# Patient Record
Sex: Female | Born: 1950 | Hispanic: Yes | Marital: Married | Smoking: Never smoker
Health system: Southern US, Community
[De-identification: ages and names within clinical notes are randomized; demographics above are authoritative.]

## PROBLEM LIST (undated history)

## (undated) DIAGNOSIS — E119 Type 2 diabetes mellitus without complications: Secondary | ICD-10-CM

## (undated) DIAGNOSIS — J40 Bronchitis, not specified as acute or chronic: Secondary | ICD-10-CM

## (undated) DIAGNOSIS — I10 Essential (primary) hypertension: Secondary | ICD-10-CM

## (undated) HISTORY — PX: ABDOMINAL HYSTERECTOMY: SHX81

---

## 2013-07-14 ENCOUNTER — Encounter (HOSPITAL_COMMUNITY): Payer: Self-pay | Admitting: Emergency Medicine

## 2013-07-14 ENCOUNTER — Emergency Department (HOSPITAL_COMMUNITY)
Admission: EM | Admit: 2013-07-14 | Discharge: 2013-07-14 | Disposition: A | Discharge status: Home / Self Care | Payer: Self-pay | Attending: Emergency Medicine | Admitting: Emergency Medicine

## 2013-07-14 DIAGNOSIS — I10 Essential (primary) hypertension: Secondary | ICD-10-CM | POA: Insufficient documentation

## 2013-07-14 DIAGNOSIS — R519 Headache, unspecified: Secondary | ICD-10-CM

## 2013-07-14 DIAGNOSIS — E119 Type 2 diabetes mellitus without complications: Secondary | ICD-10-CM | POA: Insufficient documentation

## 2013-07-14 DIAGNOSIS — R51 Headache: Secondary | ICD-10-CM | POA: Insufficient documentation

## 2013-07-14 DIAGNOSIS — J40 Bronchitis, not specified as acute or chronic: Secondary | ICD-10-CM | POA: Insufficient documentation

## 2013-07-14 HISTORY — DX: Essential (primary) hypertension: I10

## 2013-07-14 HISTORY — DX: Type 2 diabetes mellitus without complications: E11.9

## 2013-07-14 HISTORY — DX: Bronchitis, not specified as acute or chronic: J40

## 2013-07-14 LAB — BASIC METABOLIC PANEL
BUN: 14 mg/dL (ref 6–23)
CHLORIDE: 102 meq/L (ref 96–112)
CO2: 24 mEq/L (ref 19–32)
CREATININE: 0.51 mg/dL (ref 0.50–1.10)
Calcium: 8.9 mg/dL (ref 8.4–10.5)
GFR calc Af Amer: >90 mL/min (ref >90)
GFR calc non Af Amer: >90 mL/min (ref >90)
Glucose, Bld: 101 mg/dL — ABNORMAL HIGH (ref 70–99)
Potassium: 4 mEq/L (ref 3.7–5.3)
Sodium: 140 mEq/L (ref 137–147)

## 2013-07-14 LAB — CBC
HEMATOCRIT: 41 % (ref 36.0–46.0)
HEMOGLOBIN: 14 g/dL (ref 12.0–15.0)
MCH: 29.7 pg (ref 26.0–34.0)
MCHC: 34.1 g/dL (ref 30.0–36.0)
MCV: 87 fL (ref 78.0–100.0)
Platelets: 203 10*3/uL (ref 150–400)
RBC: 4.71 MIL/uL (ref 3.87–5.11)
RDW: 13.1 % (ref 11.5–15.5)
WBC: 8 10*3/uL (ref 4.0–10.5)

## 2013-07-14 LAB — SEDIMENTATION RATE: Sed Rate: 8 mm/hr (ref 0–22)

## 2013-07-14 LAB — I-STAT CHEM 8, ED
BUN: 13 mg/dL (ref 6–23)
CALCIUM ION: 1.03 mmol/L — AB (ref 1.13–1.30)
CHLORIDE: 109 meq/L (ref 96–112)
CREATININE: 0.6 mg/dL (ref 0.50–1.10)
GLUCOSE: 100 mg/dL — AB (ref 70–99)
HEMATOCRIT: 42 % (ref 36.0–46.0)
Hemoglobin: 14.3 g/dL (ref 12.0–15.0)
POTASSIUM: 3.8 meq/L (ref 3.7–5.3)
Sodium: 140 mEq/L (ref 137–147)
TCO2: 24 mmol/L (ref 0–100)

## 2013-07-14 MED ORDER — SODIUM CHLORIDE 0.9 % IV BOLUS (SEPSIS)
1000.0000 mL | Freq: Once | INTRAVENOUS | Status: AC
  Administered 2013-07-14: 1000 mL via INTRAVENOUS

## 2013-07-14 MED ORDER — KETOROLAC TROMETHAMINE 15 MG/ML IJ SOLN
15.0000 mg | Freq: Once | INTRAMUSCULAR | Status: AC
  Administered 2013-07-14: 15 mg via INTRAVENOUS
  Filled 2013-07-14: qty 1

## 2013-07-14 MED ORDER — DIPHENHYDRAMINE HCL 50 MG/ML IJ SOLN
12.5000 mg | Freq: Once | INTRAMUSCULAR | Status: AC
  Administered 2013-07-14: 12.5 mg via INTRAVENOUS
  Filled 2013-07-14: qty 1

## 2013-07-14 MED ORDER — FLUORESCEIN SODIUM 1 MG OP STRP
1.0000 | ORAL_STRIP | Freq: Once | OPHTHALMIC | Status: AC
  Administered 2013-07-14: 1 via OPHTHALMIC
  Filled 2013-07-14: qty 1

## 2013-07-14 MED ORDER — TETRACAINE HCL 0.5 % OP SOLN
1.0000 [drp] | Freq: Once | OPHTHALMIC | Status: AC
  Administered 2013-07-14: 1 [drp] via OPHTHALMIC
  Filled 2013-07-14: qty 2

## 2013-07-14 MED ORDER — METOCLOPRAMIDE HCL 5 MG/ML IJ SOLN
10.0000 mg | Freq: Once | INTRAMUSCULAR | Status: AC
  Administered 2013-07-14: 10 mg via INTRAVENOUS
  Filled 2013-07-14: qty 2

## 2013-07-14 NOTE — ED Notes (Signed)
PA at bedside.

## 2013-07-14 NOTE — Discharge Instructions (Signed)

## 2013-07-14 NOTE — ED Notes (Signed)
63 yo with c/o headache pain that started in the left eye and radiates to the left side of the head. Pt. Is unable to see clearly out of the left eye and states that it is swollen shut in the morning. Pt previously given neomycin eye drops 2 weeks ago. A/O denies N/V/D. 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

## 2013-07-14 NOTE — ED Provider Notes (Signed)
CSN: [id number]     Arrival date & time 07/14/13  [id number] History   First MD Initiated Contact with Patient 07/14/13 0827     Chief Complaint  Patient presents with  . Migraine     (Consider location/radiation/quality/duration/timing/severity/associated sxs/prior Treatment) HPI  This is a 63 y.o. female with PMH of HTN, DM, presenting with headache. Onset one month ago. Located left retro-orbital area. Waxes and wanes. Described as throbbing. Alleviated with Motrin. Radiates to the left occipital area. Negative for nausea, vomiting, change in vision, change in speech, weakness, numbness, or tingling. Negative for local swelling. Positive for family history of migraine headaches.  Past Medical History  Diagnosis Date  . Bronchitis   . Hypertension   . Diabetes mellitus without complication    Past Surgical History  Procedure Laterality Date  . Abdominal hysterectomy     No family history on file. History  Substance Use Topics  . Smoking status: Never Smoker   . Smokeless tobacco: Never Used  . Alcohol Use: No   OB History   Grav Para Term Preterm Abortions TAB SAB Ect Mult Living                 Review of Systems  Constitutional: Negative for fever and chills.  HENT: Negative for facial swelling.   Eyes: Negative for photophobia and pain.  Respiratory: Negative for cough and shortness of breath.   Cardiovascular: Negative for chest pain and leg swelling.  Gastrointestinal: Negative for nausea, vomiting and abdominal pain.  Genitourinary: Negative for dysuria.  Musculoskeletal: Negative for arthralgias.  Skin: Negative for rash and wound.  Neurological: Positive for headaches. Negative for seizures.  Hematological: Negative for adenopathy.      Allergies  Review of patient's allergies indicates no known allergies.  Home Medications   Prior to Admission medications   Medication Sig Start Date End Date Taking? Authorizing Provider  ibuprofen (ADVIL,MOTRIN) 200 MG  tablet Take 600 mg by mouth every 6 (six) hours as needed for headache.   Yes Historical Provider, MD  lisinopril (PRINIVIL,ZESTRIL) 10 MG tablet Take 10 mg by mouth daily.   Yes Historical Provider, MD  metFORMIN (GLUCOPHAGE) 500 MG tablet Take 500 mg by mouth daily.   Yes Historical Provider, MD  neomycin-polymyxin-dexamethasone (MAXITROL) 0.1 % ophthalmic suspension Place 1 drop into both eyes 4 (four) times daily.   Yes Historical Provider, MD   BP 154/88  Pulse 80  Temp(Src) 97.6 F (36.4 C)  Resp 14  SpO2 99% Physical Exam  Constitutional: She is oriented to person, place, and time. She appears well-developed and well-nourished. No distress.  HENT:  Head: Normocephalic and atraumatic.  Mouth/Throat: No oropharyngeal exudate.  Eyes: Lids are normal. Lids are everted and swept, no foreign bodies found. Right eye exhibits no chemosis, no discharge, no exudate and no hordeolum. No foreign body present in the right eye. Left eye exhibits no chemosis, no discharge, no exudate and no hordeolum. No foreign body present in the left eye. Right conjunctiva is injected. No scleral icterus. Right eye exhibits normal extraocular motion and no nystagmus. Left eye exhibits normal extraocular motion and no nystagmus. Right pupil is round and reactive. Left pupil is round and reactive. Pupils are equal.  Fundoscopic exam:      The right eye shows no papilledema.       The left eye shows no papilledema.  Slit lamp exam:      The right eye shows no corneal abrasion.  The left eye shows no corneal abrasion and no fluorescein uptake.  IOP WNL on tono pen exam  Neck: Normal range of motion. No tracheal deviation present. No thyromegaly present.  Cardiovascular: Normal rate, regular rhythm and normal heart sounds.  Exam reveals no gallop and no friction rub.   No murmur heard. Pulmonary/Chest: Effort normal and breath sounds normal. No stridor. No respiratory distress. She has no wheezes. She has no  rales. She exhibits no tenderness.  Abdominal: Soft. She exhibits no distension and no mass. There is no tenderness. There is no rebound and no guarding.  Musculoskeletal: Normal range of motion. She exhibits no edema.  Neurological: She is alert and oriented to person, place, and time.  Skin: Skin is warm and dry. She is not diaphoretic.    ED Course  Procedures (including critical care time) Labs Review Labs Reviewed  BASIC METABOLIC PANEL - Abnormal; Notable for the following:    Glucose, Bld 101 (*)    All other components within normal limits  I-STAT CHEM 8, ED - Abnormal; Notable for the following:    Glucose, Bld 100 (*)    Calcium, Ion 1.03 (*)    All other components within normal limits  CBC  SEDIMENTATION RATE    MDM   Final diagnoses:  None    This is a 63 y.o. female with PMH of HTN, DM, presenting with headache. Onset one month ago. Located left retro-orbital area. Waxes and wanes. Described as throbbing. Alleviated with Motrin. Radiates to the left occipital area. Negative for nausea, vomiting, change in vision, change in speech, weakness, numbness, or tingling. Negative for local swelling. Positive for family history of migraine headaches.  ENT, neurologic exam is within normal limits. I do not believe that this represents ICH, CVA, thrombosis, dissection, aneurysm, meningitis, temporal arteritis, glaucoma or any other concerning etiology. I've treated with fluids, Toradol, Reglan, Benadryl with resolution of headache. Reexamination reveals no concerning findings. She is currently asymptomatic.  Patient's intraocular pressures within normal limits. Negative for corneal ulcer. Patient is ambulating without assistance.Pt stable for discharge, FU.  All questions answered.  Return precautions given.  I have discussed case and care has been guided by my attending physician, Dr. Loretha StaplerWofford.  Loma BostonStirling Emet Rafanan, MD 07/14/13 [phone]

## 2013-07-16 NOTE — ED Provider Notes (Signed)
I saw and evaluated the patient, reviewed the resident's note and I agree with the findings and plan.   EKG Interpretation None      63 yo female with headache which has been consistent with several headaches that she has experienced recently.  Her headaches are sometimes on the right side and sometimes on the left side.  Today it's on the left.  On exam, alert, oriented, nontoxic, but uncomfortable appearing, photophobic with sunglasses on, PERRL, equal grip strength, normal perfusion and respiratory effort.  Headache resolved with treatment in ED.  Advised follow up.    Clinical Impression: 1. Headache         Candyce ChurnJohn David Adaly Puder III, MD 07/16/13 [phone]

## 2019-01-06 ENCOUNTER — Other Ambulatory Visit: Payer: Self-pay | Admitting: Family Medicine

## 2019-01-06 DIAGNOSIS — Z1231 Encounter for screening mammogram for malignant neoplasm of breast: Secondary | ICD-10-CM

## 2019-01-08 ENCOUNTER — Other Ambulatory Visit: Payer: Self-pay

## 2019-01-08 ENCOUNTER — Ambulatory Visit
Admission: RE | Admit: 2019-01-08 | Discharge: 2019-01-08 | Disposition: A | Discharge status: Home / Self Care | Payer: Medicare Other | Source: Ambulatory Visit | Attending: Family Medicine | Admitting: Family Medicine

## 2019-01-08 DIAGNOSIS — Z1231 Encounter for screening mammogram for malignant neoplasm of breast: Secondary | ICD-10-CM

## 2020-11-07 IMAGING — MG DIGITAL SCREENING BILAT W/ TOMO W/ CAD
8 series · 9 of 24 positions shown · non-contrast
Comparison: None.

ACR Breast Density Category a: The breast tissue is almost entirely
fatty.

CLINICAL DATA: Screening.

EXAM:
DIGITAL SCREENING BILATERAL MAMMOGRAM WITH TOMO AND CAD

[L CC synth-2D]
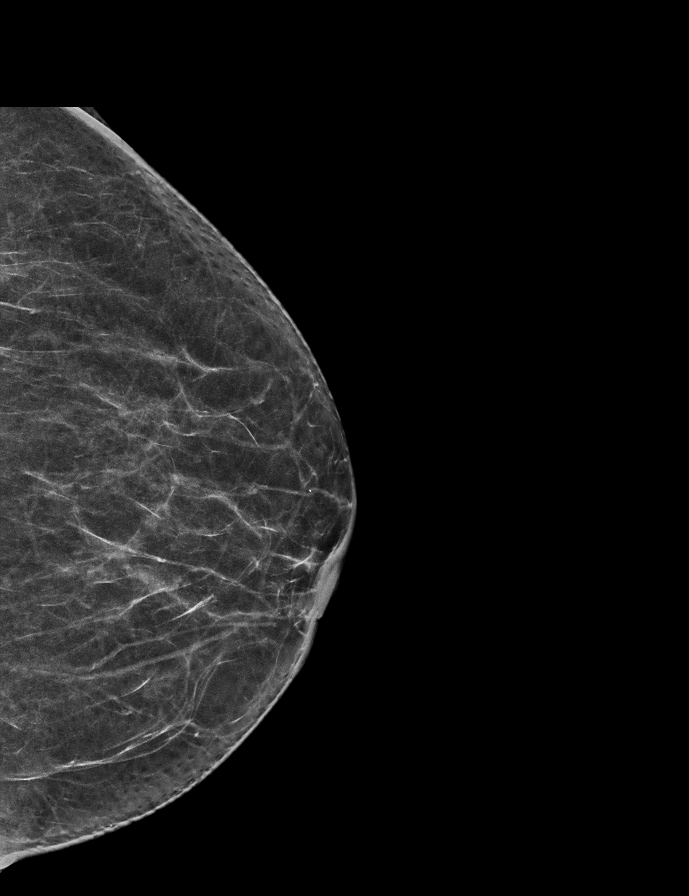

[R MLO synth-2D]
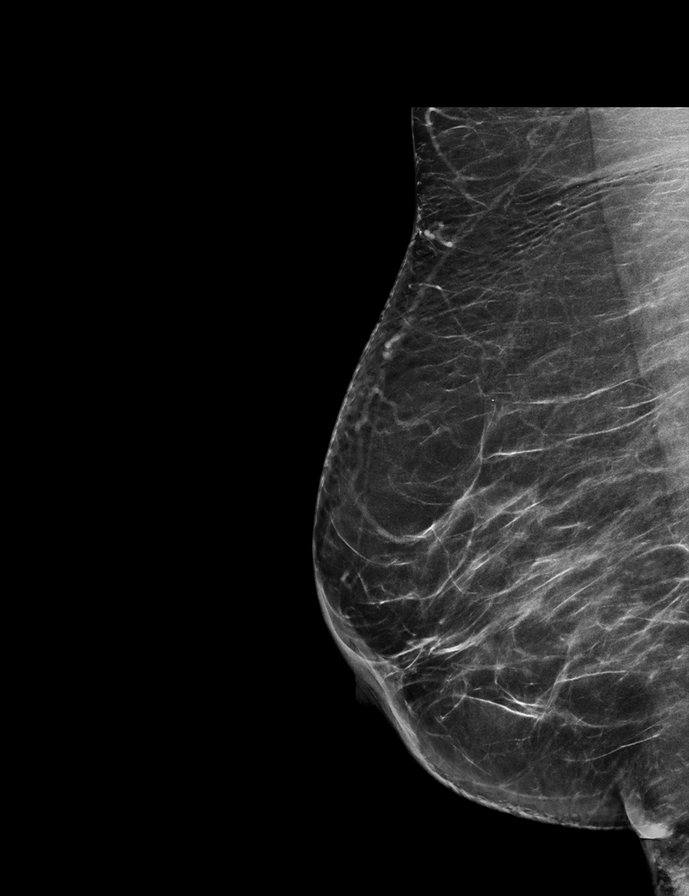

[R CC synth-2D]
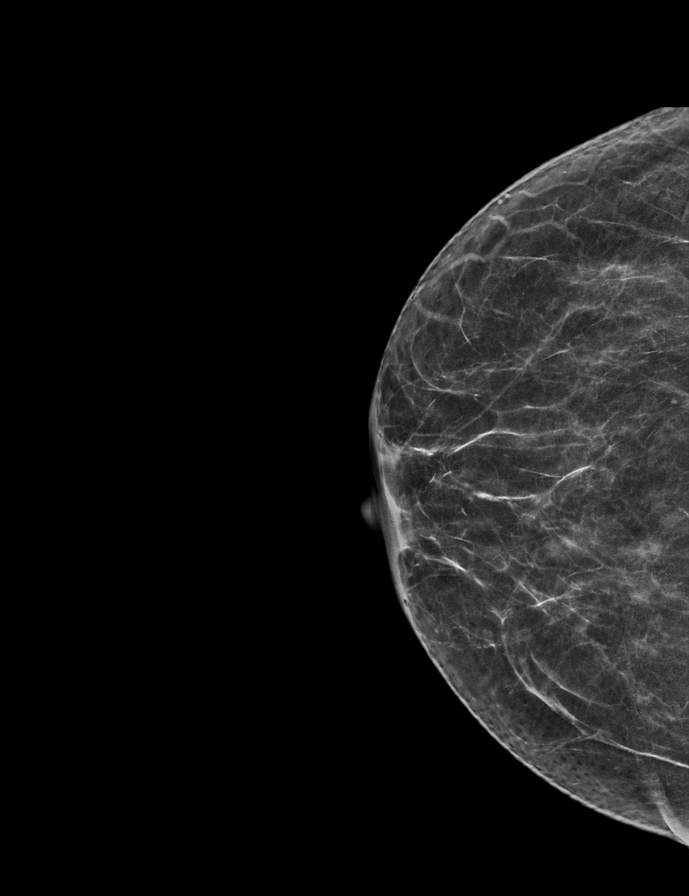

[L MLO synth-2D]
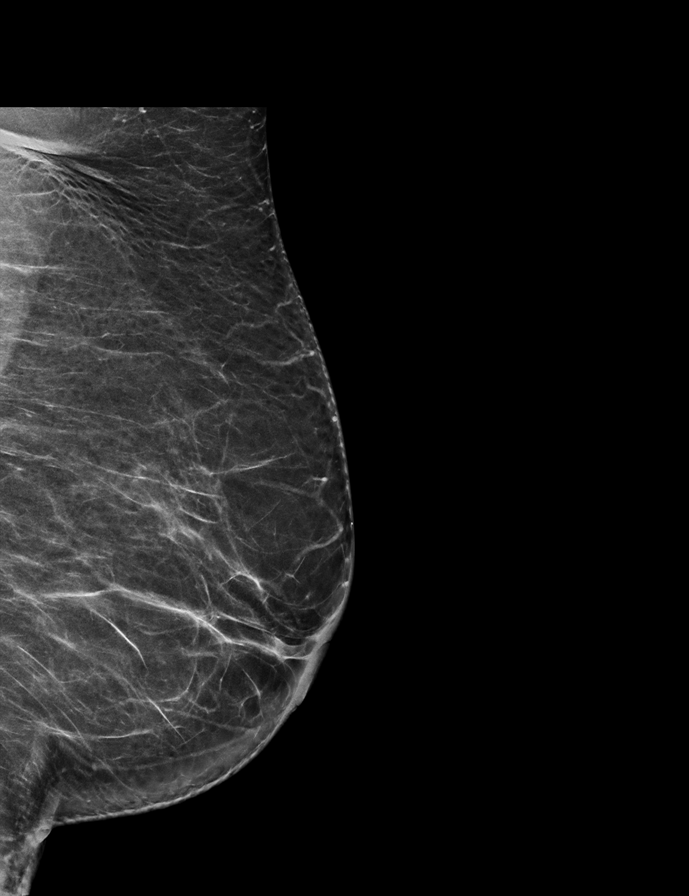

[R MLO tomo · 2 of 65 frames shown]
[frame 21/65]
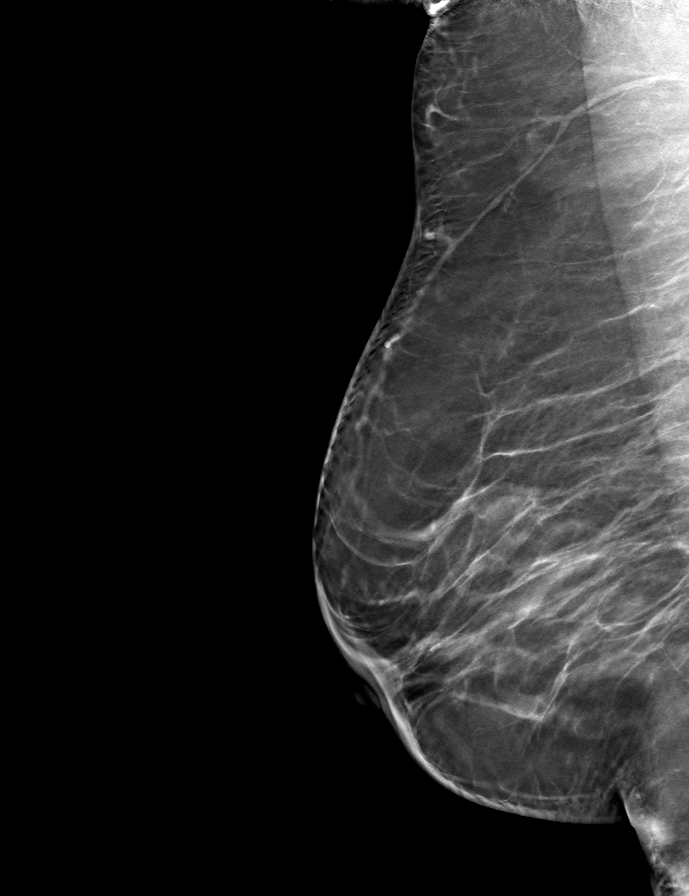
[frame 33/65]
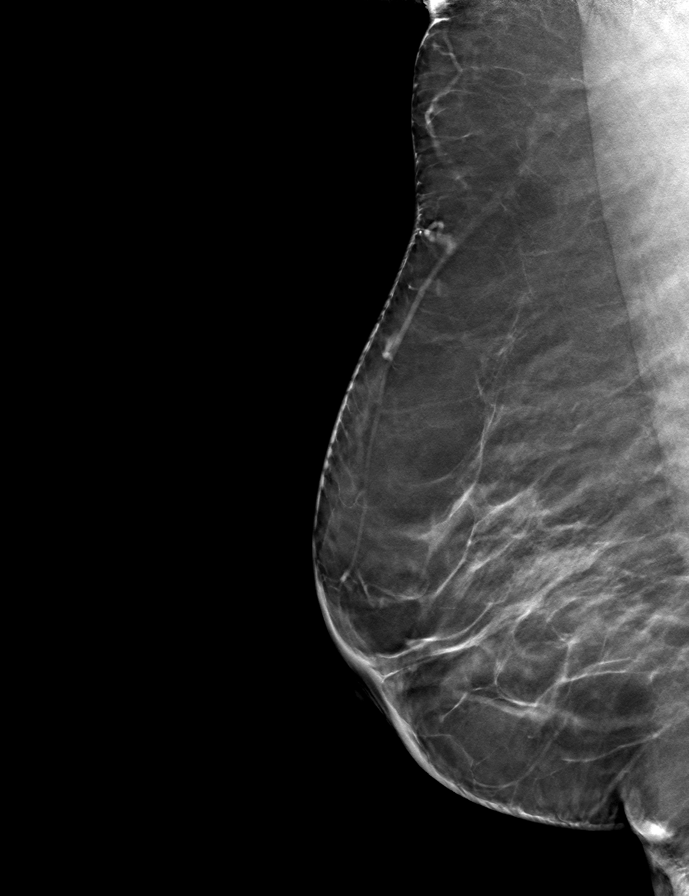

[L CC tomo · tomo slice 31/60.0]
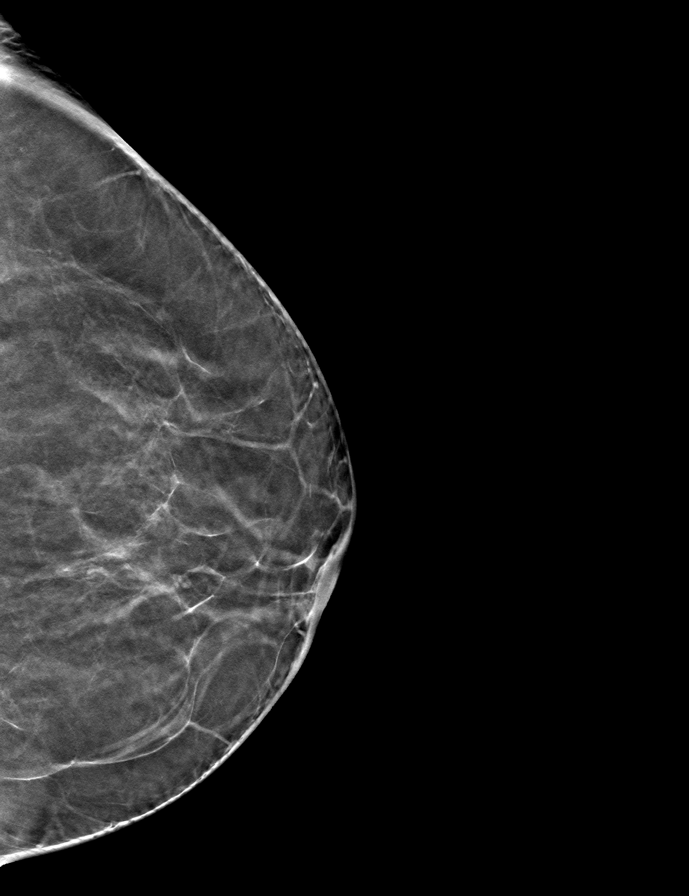

[R CC tomo · tomo slice 28/55.0]
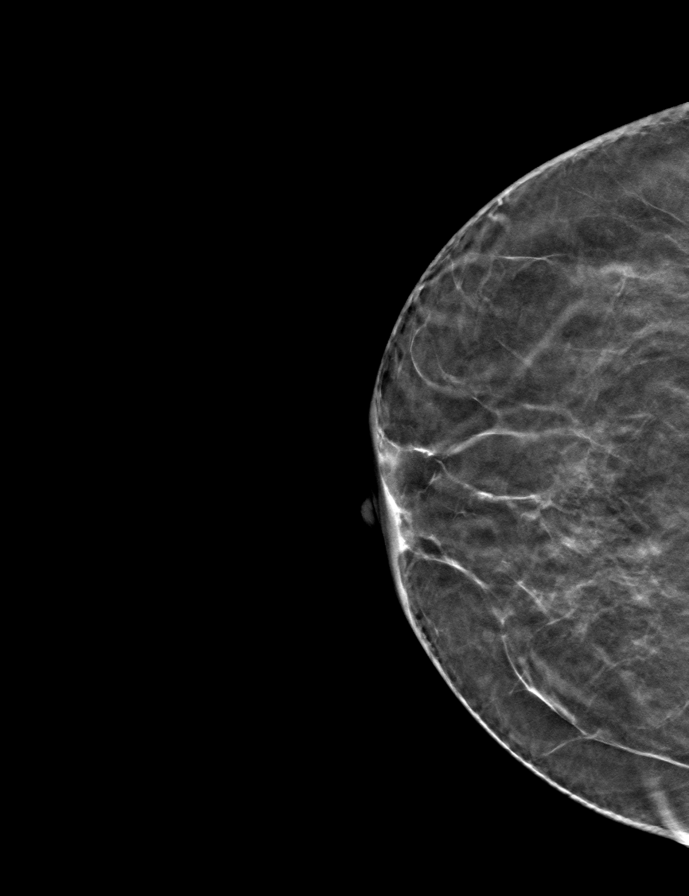

[L MLO tomo · tomo slice 34/67.0]
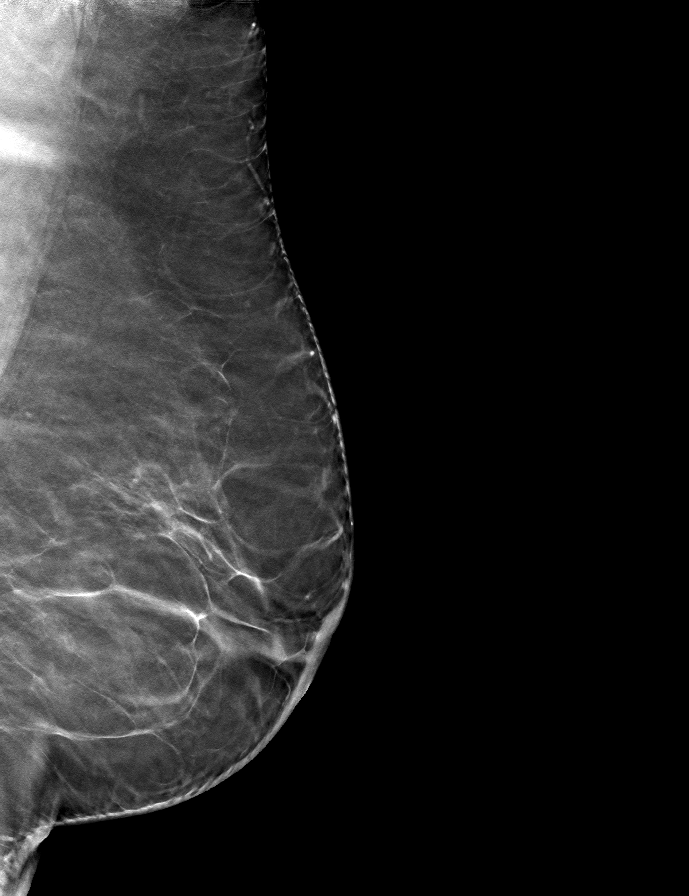

[9 of 24 positions shown; findings below may reference images not displayed]

FINDINGS: There are no findings suspicious for malignancy. Images were
processed with CAD.
IMPRESSION: No mammographic evidence of malignancy. A result letter of this
screening mammogram will be mailed directly to the patient.

RECOMMENDATION:
Screening mammogram in one year. (Code:[id number])

BI-RADS CATEGORY  1: Negative.

## 2022-11-30 LAB — COLOGUARD: COLOGUARD: NEGATIVE

## 2022-11-30 LAB — EXTERNAL GENERIC LAB PROCEDURE: COLOGUARD: NEGATIVE

## 2022-12-18 ENCOUNTER — Emergency Department (HOSPITAL_COMMUNITY): Payer: Medicare Other

## 2022-12-18 ENCOUNTER — Other Ambulatory Visit: Payer: Self-pay

## 2022-12-18 ENCOUNTER — Encounter (HOSPITAL_COMMUNITY): Payer: Self-pay

## 2022-12-18 ENCOUNTER — Emergency Department (HOSPITAL_COMMUNITY)
Admission: EM | Admit: 2022-12-18 | Discharge: 2022-12-19 | Disposition: A | Discharge status: Home / Self Care | Payer: Medicare Other | Attending: Emergency Medicine | Admitting: Emergency Medicine

## 2022-12-18 DIAGNOSIS — R911 Solitary pulmonary nodule: Secondary | ICD-10-CM | POA: Diagnosis not present

## 2022-12-18 DIAGNOSIS — W010XXA Fall on same level from slipping, tripping and stumbling without subsequent striking against object, initial encounter: Secondary | ICD-10-CM | POA: Diagnosis not present

## 2022-12-18 DIAGNOSIS — S299XXA Unspecified injury of thorax, initial encounter: Secondary | ICD-10-CM | POA: Diagnosis present

## 2022-12-18 DIAGNOSIS — S2231XA Fracture of one rib, right side, initial encounter for closed fracture: Secondary | ICD-10-CM | POA: Diagnosis not present

## 2022-12-18 DIAGNOSIS — W19XXXA Unspecified fall, initial encounter: Secondary | ICD-10-CM

## 2022-12-18 MED ORDER — HYDROCODONE-ACETAMINOPHEN 5-325 MG PO TABS
1.0000 | ORAL_TABLET | Freq: Once | ORAL | Status: AC
  Administered 2022-12-18: 1 via ORAL
  Filled 2022-12-18: qty 1

## 2022-12-18 NOTE — ED Provider Triage Note (Signed)
Emergency Medicine Provider Triage Evaluation Note  Alexis Ponce , a 72 y.o. female  was evaluated in triage.  Pt complains of fall.  Patient states that she was in the shower when she fell like she slipped on soap on the ground and landed on her right side.  Currently complaining of right-sided chest pain as well as headache/right neck pain.  Patient reports hitting her head but denies loss conscious, blood thinner use.  States that she feels a little short of breath due to chest pain.  Denies any nausea, vomiting, visual symptoms, gait abnormality, slurred speech, weakness/sensory deficits in the lower extremities..  Review of Systems  Positive: See above Negative:   Physical Exam  BP (!) 165/83 (BP Location: Left Arm)   Pulse 100   Temp 98.1 F (36.7 C) (Oral)   Resp 18   Ht 5' (1.524 m)   Wt 61.7 kg   SpO2 99%   BMI 26.56 kg/m  Gen:   Awake, no distress   Resp:  Normal effort  MSK:   Moves extremities without difficulty  Other:  No midline tenderness of cervical, thoracic, lumbar spine.  Right paraspinal tenderness in cervical region.  Right chest wall tenderness.  No abdominal tenderness.  Cranial nerves III through XII grossly intact.  Medical Decision Making  Medically screening exam initiated at 8:07 PM.  Appropriate orders placed.  Alexis Ponce was informed that the remainder of the evaluation will be completed by another provider, this initial triage assessment does not replace that evaluation, and the importance of remaining in the ED until their evaluation is complete.     Peter Garter, [place] 12/18/22 2008

## 2022-12-18 NOTE — ED Triage Notes (Signed)
Pt coming in via POV with c/o fall that occurred around 1800 today. Pt states she was in the shower when she slipped. She endorses neck tenderness and pain to the right side. No LOC but states she did hit her head. Endorses SOB, denies chest pain, n/v. Pt a&o x4.

## 2022-12-19 ENCOUNTER — Emergency Department (HOSPITAL_COMMUNITY): Payer: Medicare Other

## 2022-12-19 MED ORDER — OXYCODONE-ACETAMINOPHEN 5-325 MG PO TABS: 1.0000 | ORAL_TABLET | Freq: Four times a day (QID) | ORAL | 0 refills | Status: AC | PRN

## 2022-12-19 MED ORDER — OXYCODONE-ACETAMINOPHEN 5-325 MG PO TABS
1.0000 | ORAL_TABLET | Freq: Once | ORAL | Status: AC
  Administered 2022-12-19: 1 via ORAL
  Filled 2022-12-19: qty 1

## 2022-12-19 NOTE — ED Provider Notes (Signed)
[facility] AT [facility] Provider Note   CSN: [id number] Arrival date & time: 12/18/22  1905     History Chief Complaint  Patient presents with   Alexis Ponce    Alexis Ponce is a 72 y.o. female.  Patient without significant past medical history presents to the emergency department with concerns of a fall.  Reports that she had mechanical fall after she slipped getting out of the shower.  States that she hit the right side of her chest and neck into the shower.  Endorsing mild headache but denies any loss of consciousness or head strike.  Does state that she is having some difficulty with deep inhalation due to pain.  No blurry vision, headache, weakness, numbness.  Not currently on any blood thinners.   Fall       Home Medications Prior to Admission medications   Medication Sig Start Date End Date Taking? Authorizing Provider  oxyCODONE-acetaminophen (PERCOCET/ROXICET) 5-325 MG tablet Take 1 tablet by mouth every 6 (six) hours as needed for severe pain. 12/19/22  Yes Smitty Knudsen, PA-C  ibuprofen (ADVIL,MOTRIN) 200 MG tablet Take 600 mg by mouth every 6 (six) hours as needed for headache.    [provider]  lisinopril (PRINIVIL,ZESTRIL) 10 MG tablet Take 10 mg by mouth daily.    [provider]  metFORMIN (GLUCOPHAGE) 500 MG tablet Take 500 mg by mouth daily.    [provider]  neomycin-polymyxin-dexamethasone (MAXITROL) 0.1 % ophthalmic suspension Place 1 drop into both eyes 4 (four) times daily.    [provider]      Allergies    Penicillins    Review of Systems   Review of Systems  Musculoskeletal:  Positive for neck pain.  All other systems reviewed and are negative.   Physical Exam Updated Vital Signs BP (!) 149/79   Pulse 68   Temp 97.6 F (36.4 C)   Resp 16   Ht 5' (1.524 m)   Wt 61.7 kg   SpO2 98%   BMI 26.56 kg/m  Physical Exam Vitals and nursing note reviewed.  Constitutional:       General: She is not in acute distress.    Appearance: She is well-developed.  HENT:     Head: Normocephalic and atraumatic.  Eyes:     Conjunctiva/sclera: Conjunctivae normal.  Cardiovascular:     Rate and Rhythm: Normal rate and regular rhythm.     Heart sounds: No murmur heard. Pulmonary:     Effort: Pulmonary effort is normal. No respiratory distress.     Breath sounds: Normal breath sounds.  Abdominal:     Palpations: Abdomen is soft.     Tenderness: There is no abdominal tenderness.  Musculoskeletal:        General: Tenderness present. No swelling or deformity. Normal range of motion.       Arms:     Cervical back: Neck supple.  Skin:    General: Skin is warm and dry.     Capillary Refill: Capillary refill takes less than 2 seconds.  Neurological:     Mental Status: She is alert.  Psychiatric:        Mood and Affect: Mood normal.     ED Results / Procedures / Treatments   Labs (all labs ordered are listed, but only abnormal results are displayed) Labs Reviewed - No data to display  EKG None  Radiology CT Chest Wo Contrast  Result Date: 12/19/2022 CLINICAL DATA:  Fall in  shower with right-sided pain and neck tenderness. EXAM: CT CHEST WITHOUT CONTRAST TECHNIQUE: Multidetector CT imaging of the chest was performed following the standard protocol without IV contrast. RADIATION DOSE REDUCTION: This exam was performed according to the departmental dose-optimization program which includes automated exposure control, adjustment of the mA and/or kV according to patient size and/or use of iterative reconstruction technique. COMPARISON:  None Available. FINDINGS: Cardiovascular: No significant vascular findings. Normal heart size. No pericardial effusion. Atheromatous calcification of the aorta and coronaries. Mediastinum/Nodes: No pneumomediastinum or hematoma. Lungs/Pleura: No hemothorax, pneumothorax, or pulmonary contusion. Reticulation and volume loss at the lung bases  attributed atelectasis. Calcified pulmonary nodules scattered in the bilateral lungs. Uncalcified nodule in the right upper lobe on 4:51 measuring 3 mm. No follow-up needed if patient is low-risk.This recommendation follows the consensus statement: Guidelines for Management of Incidental Pulmonary Nodules Detected on CT Images: From the Fleischner Society 2017; Radiology 2017; 284:228-243. Upper Abdomen: No evidence of injury. Coarse calcification measuring 2 cm to superior to the pancreatic body, described on a 2013 abdominal ultrasound and appearing dystrophic. Musculoskeletal: Single cortex fracture deformity involving the posterior right eighth rib. Generalized thoracic spondylosis with multi-level ankylosis.Shoulder and intervertebral degeneration with ganglion extending medially from the right shoulder. IMPRESSION: 1. Nondisplaced right posterior eighth rib fracture. 2. No evidence of intrathoracic injury. 3. Pulmonary nodule in the right upper lobe measuring 3 mm. No follow-up needed if patient is low-risk.This recommendation follows the consensus statement: Guidelines for Management of Incidental Pulmonary Nodules Detected on CT Images: From the Fleischner Society 2017; Radiology 2017; 284:228-243. Electronically Signed   By: Tiburcio Pea M.D.   On: 12/19/2022 04:21   CT Cervical Spine Wo Contrast  Result Date: 12/19/2022 CLINICAL DATA:  Fall EXAM: CT CERVICAL SPINE WITHOUT CONTRAST TECHNIQUE: Multidetector CT imaging of the cervical spine was performed without intravenous contrast. Multiplanar CT image reconstructions were also generated. RADIATION DOSE REDUCTION: This exam was performed according to the departmental dose-optimization program which includes automated exposure control, adjustment of the mA and/or kV according to patient size and/or use of iterative reconstruction technique. COMPARISON:  None Available. FINDINGS: Alignment: No static subluxation. Facets are aligned. Occipital condyles  and the lateral masses of C1 and C2 are normally approximated. Skull base and vertebrae: No acute fracture. Soft tissues and spinal canal: No prevertebral fluid or swelling. No visible canal hematoma. Disc levels: Multilevel facet arthrosis. Degenerative disc disease is greatest at C5-6. No high-grade spinal canal stenosis. There is narrowing of the left C5 and C7 neural foramina. Upper chest: No pneumothorax, pulmonary nodule or pleural effusion. Calcified granuloma in the right lung apex. Other: Normal visualized paraspinal cervical soft tissues. IMPRESSION: 1. No acute fracture or static subluxation of the cervical spine. 2. Multilevel facet arthrosis and degenerative disc disease. Electronically Signed   By: Deatra Robinson M.D.   On: 12/19/2022 03:59   DG Ribs Unilateral W/Chest Right  Result Date: 12/18/2022 CLINICAL DATA:  Larey Seat in the shower with right chest trauma. EXAM: RIGHT RIBS AND CHEST - 3+ VIEW COMPARISON:  None Available. FINDINGS: No displaced fracture or other bone lesions are seen involving the ribs. There is no evidence of pneumothorax or pleural effusion. Both lungs are clear of infiltrates, with a calcified granuloma noted in the medial right lung apex. There is calcification of the transverse aorta. Heart size and mediastinal contours are within normal limits. Moderate thoracic spondylosis. Osteopenia. There are dystrophic calcifications within the right acromiohumeral space, consistent with calcifications in the supraspinatus/infraspinatus tendon complex  which can be seen with chronic calcific tendinopathy. There is also narrowing of both the acromiohumeral spaces consistent with chronic rotator cuff arthropathy. IMPRESSION: 1. No displaced rib fracture or other bone lesions are seen. 2. No evidence of acute cardiopulmonary disease. 3. 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 4. Narrowing of both acromiohumeral spaces consistent with chronic  rotator cuff arthropathy. Electronically Signed   By: Almira Bar M.D.   On: 12/18/2022 22:15   CT Head Wo Contrast  Result Date: 12/18/2022 CLINICAL DATA:  Fall with minor head trauma. EXAM: CT HEAD WITHOUT CONTRAST TECHNIQUE: Contiguous axial images were obtained from the base of the skull through the vertex without intravenous contrast. RADIATION DOSE REDUCTION: This exam was performed according to the departmental dose-optimization program which includes automated exposure control, adjustment of the mA and/or kV according to patient size and/or use of iterative reconstruction technique. COMPARISON:  None Available. FINDINGS: Brain: No evidence of acute infarction, hemorrhage, hydrocephalus, extra-axial collection or mass lesion/mass effect. There is trace senescent mineralization in the basal ganglia. Mild atrophy without significant small-vessel changes. The ventricles are normal in size and position. Vascular: There are scattered calcifications in the distal vertebral arteries and carotid siphons. No hyperdense central vessel is seen. Skull: Negative for fractures or focal lesions. Sinuses/Orbits: There is mild membrane thickening in the ethmoids. Other paranasal sinuses, bilateral mastoid air cells, middle ears are clear. Nasal septum is deviated slightly left. Negative orbits. 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 IMPRESSION: 1. No acute intracranial CT findings or depressed skull fractures. 2. Mild atrophy. 3. Sinus membrane disease. Electronically Signed   By: Almira Bar M.D.   On: 12/18/2022 22:10    Procedures Procedures   Medications Ordered in ED Medications  HYDROcodone-acetaminophen (NORCO/VICODIN) 5-325 MG per tablet 1 tablet (1 tablet Oral Given 12/18/22 2007)  oxyCODONE-acetaminophen (PERCOCET/ROXICET) 5-325 MG per tablet 1 tablet (1 tablet Oral Given 12/19/22 0232)    ED Course/ Medical Decision Making/ A&P                                Medical Decision Making Amount and/or Complexity of Data Reviewed Radiology: ordered.  Risk Prescription drug management.   This patient presents to the ED for concern of fall.  Differential diagnosis includes intracranial bleed, rib fracture, pneumothorax, syncope   Imaging Studies ordered:  I ordered imaging studies including x-ray chest with right ribs, CT head, CT cervical spine, CT chest I independently visualized and interpreted imaging which showed no evidence of any acute injury on chest x-ray, CT head and CT cervical spine unremarkable, CT chest with notable nondisplaced rib fracture of the right eighth rib.  Also incidental finding of pulmonary nodule in the right upper lobe measuring approximately 3 mm I agree with the radiologist interpretation   Medicines ordered and prescription drug management:  I ordered medication including Norco, Percocet for pain Reevaluation of the patient after these medicines showed that the patient improved I have reviewed the patients home medicines and have made adjustments as needed   Problem List / ED Course:  Patient presented to the emergency department concerns of a fall.  She reports that she tried to get out of the shower earlier this evening when she slipped as she stepped out and landed against the right side of her chest against the shower wall.  Denies any immediate difficulty breathing  but states that respirations are painful.  Endorses pain primarily to the posterior right side of her posterior chest wall.  No appreciable or obvious visible deformity on body and no lacerations noted.  However given patient's age and mechanism of injury, will image with CT imaging of the head, cervical spine, as well as CT chest as x-ray of the ribs are unremarkable. CT imaging of the head and neck without any obvious deformity or abnormality.  CT chest however is concerning for nondisplaced fracture at the right posterior eighth rib.  No  evidence of pneumothorax or other pulmonary injury.  An incidental finding of a pulmonary nodule however on the right upper lobe measuring approximately 3 mm. Informed patient of notable rib fracture at the posterior right eighth rib.  Will plan on management using incentive spirometer as well as analgesic control.  Otherwise given isolated single fracture and no evidence of flail chest or pneumothorax, believe the patient is safe and stable for discharge home.  Patient is agreement with this plan and verbalized understanding strict return precautions.  Patient discharged home in stable condition. Regarding incidental finding of pulmonary nodule, informed patient of this result and need for follow-up with primary care provider.  Patient is currently low risk given no prior history of smoking and no prior malignancy history.  However, advised patient to follow-up with primary care provider for possible repeat imaging to further assess this.  Patient is agreeable with this and will reach out to primary care provider to schedule follow-up appointment.   Final Clinical Impression(s) / ED Diagnoses Final diagnoses:  Closed traumatic nondisplaced fracture of one rib of right side, initial encounter  Fall, initial encounter  Pulmonary nodule, right    Rx / DC Orders ED Discharge Orders          Ordered    oxyCODONE-acetaminophen (PERCOCET/ROXICET) 5-325 MG tablet  Every 6 hours PRN        12/19/22 0501              Smitty Knudsen, PA-C 12/19/22 2213    Nira Conn, MD 12/23/22 1754

## 2022-12-19 NOTE — Discharge Instructions (Addendum)
Le atendieron [employer] en el departamento de urgencias despus de una cada.  Sus imgenes muestran que hay una fractura no desplazada en la octava costilla del lado derecho.  Probablemente esto es lo que le est causando dolor.  Como existe la preocupacin de que las fracturas costales puedan provocar neumona, se le proporciona un espirmetro incentivador para reducir Chemical engineer.  Tambin haba un ndulo pulmonar notable en el lbulo superior derecho.  Haga un seguimiento con su proveedor de atencin primaria para Program[employer] systems al [employer].  Si sus sntomas empeoran, regrese al departamento de emergencias.  You were seen in the emergency department today following a fall.  Your imaging shows that there is a nondisplaced fracture in the right side eighth rib.  This is likely what is causing her pain.  As there is concern that rib fractures can lead to pneumonia, you are provided with incentive spirometer to reduce this risk.  There was also a notable lung nodule on the right upper lobe.  Please follow-up with your primary care provider for reevaluation regarding this.  If you have any worsening in symptoms, return to the emergency department.

## 2024-01-26 ENCOUNTER — Other Ambulatory Visit: Payer: Self-pay | Admitting: Family Medicine

## 2024-01-26 DIAGNOSIS — Z780 Asymptomatic menopausal state: Secondary | ICD-10-CM

## 2024-01-26 DIAGNOSIS — Z1231 Encounter for screening mammogram for malignant neoplasm of breast: Secondary | ICD-10-CM

## 2024-01-28 ENCOUNTER — Other Ambulatory Visit: Payer: Self-pay | Admitting: Family Medicine

## 2024-01-28 DIAGNOSIS — M6289 Other specified disorders of muscle: Secondary | ICD-10-CM

## 2024-01-28 DIAGNOSIS — R3129 Other microscopic hematuria: Secondary | ICD-10-CM
